# Patient Record
Sex: Female | Born: 1955 | Race: White | Hispanic: No | Marital: Single | Smoking: Never smoker
Health system: Southern US, Community
[De-identification: ages and names within clinical notes are randomized; demographics above are authoritative.]

## PROBLEM LIST (undated history)

## (undated) DIAGNOSIS — I10 Essential (primary) hypertension: Secondary | ICD-10-CM

## (undated) DIAGNOSIS — K219 Gastro-esophageal reflux disease without esophagitis: Secondary | ICD-10-CM

## (undated) DIAGNOSIS — E079 Disorder of thyroid, unspecified: Secondary | ICD-10-CM

## (undated) HISTORY — PX: CHOLECYSTECTOMY: SHX55

---

## 2013-11-25 ENCOUNTER — Encounter (HOSPITAL_COMMUNITY): Payer: Self-pay | Admitting: Emergency Medicine

## 2013-11-25 ENCOUNTER — Emergency Department (HOSPITAL_COMMUNITY): Payer: BC Managed Care – PPO

## 2013-11-25 ENCOUNTER — Emergency Department (HOSPITAL_COMMUNITY)
Admission: EM | Admit: 2013-11-25 | Discharge: 2013-11-25 | Disposition: A | Discharge status: Home / Self Care | Payer: BC Managed Care – PPO | Attending: Emergency Medicine | Admitting: Emergency Medicine

## 2013-11-25 DIAGNOSIS — Z8719 Personal history of other diseases of the digestive system: Secondary | ICD-10-CM | POA: Insufficient documentation

## 2013-11-25 DIAGNOSIS — R42 Dizziness and giddiness: Secondary | ICD-10-CM | POA: Insufficient documentation

## 2013-11-25 DIAGNOSIS — E079 Disorder of thyroid, unspecified: Secondary | ICD-10-CM | POA: Insufficient documentation

## 2013-11-25 DIAGNOSIS — R059 Cough, unspecified: Secondary | ICD-10-CM | POA: Insufficient documentation

## 2013-11-25 DIAGNOSIS — I10 Essential (primary) hypertension: Secondary | ICD-10-CM

## 2013-11-25 DIAGNOSIS — R945 Abnormal results of liver function studies: Secondary | ICD-10-CM | POA: Insufficient documentation

## 2013-11-25 DIAGNOSIS — R7989 Other specified abnormal findings of blood chemistry: Secondary | ICD-10-CM

## 2013-11-25 DIAGNOSIS — J3489 Other specified disorders of nose and nasal sinuses: Secondary | ICD-10-CM | POA: Insufficient documentation

## 2013-11-25 DIAGNOSIS — R0789 Other chest pain: Secondary | ICD-10-CM

## 2013-11-25 DIAGNOSIS — R05 Cough: Secondary | ICD-10-CM | POA: Insufficient documentation

## 2013-11-25 HISTORY — DX: Gastro-esophageal reflux disease without esophagitis: K21.9

## 2013-11-25 HISTORY — DX: Disorder of thyroid, unspecified: E07.9

## 2013-11-25 LAB — COMPREHENSIVE METABOLIC PANEL
ALT: 48 U/L — ABNORMAL HIGH (ref 0–35)
AST: 54 U/L — ABNORMAL HIGH (ref 0–37)
Alkaline Phosphatase: 62 U/L (ref 39–117)
CO2: 26 mEq/L (ref 19–32)
Calcium: 10.5 mg/dL (ref 8.4–10.5)
Potassium: 3.7 mEq/L (ref 3.5–5.1)
Sodium: 140 mEq/L (ref 135–145)
Total Protein: 8.4 g/dL — ABNORMAL HIGH (ref 6.0–8.3)

## 2013-11-25 LAB — CBC WITH DIFFERENTIAL/PLATELET
Basophils Absolute: 0 10*3/uL (ref 0.0–0.1)
Basophils Relative: 1 % (ref 0–1)
Eosinophils Absolute: 0.2 10*3/uL (ref 0.0–0.7)
Eosinophils Relative: 3 % (ref 0–5)
Lymphocytes Relative: 33 % (ref 12–46)
Lymphs Abs: 2.2 10*3/uL (ref 0.7–4.0)
MCHC: 33.6 g/dL (ref 30.0–36.0)
MCV: 89.9 fL (ref 78.0–100.0)
Monocytes Absolute: 0.4 10*3/uL (ref 0.1–1.0)
Neutrophils Relative %: 58 % (ref 43–77)
Platelets: 238 10*3/uL (ref 150–400)
RBC: 4.64 MIL/uL (ref 3.87–5.11)
RDW: 13 % (ref 11.5–15.5)
WBC: 6.6 10*3/uL (ref 4.0–10.5)

## 2013-11-25 NOTE — ED Notes (Signed)
Pt denies any complaints at this time

## 2013-11-25 NOTE — ED Notes (Signed)
Pt arrived and computers in down time.

## 2013-11-25 NOTE — ED Provider Notes (Signed)
CSN: [id number]     Arrival date & time 11/25/13  1734 History   First MD Initiated Contact with Patient 11/25/13 1745   This chart was scribed for Ward Givens, MD by Valera Castle, ED Scribe. This patient was seen in room [facility]12[facility] and the patient's care was started at 5:46 PM.    Chief Complaint  Patient presents with  . Chest Pain   The history is provided by the patient. No language interpreter was used.   HPI Comments: AHAVA KISSOON is a 57 y.o. female who presents to the Emergency Department complaining of one episode of sudden, sharp chest pain, that lasted for a few seconds, with associated dizziness, onset earlier this afternoon while standing at  work. She reports she had to sit down from a standing position due to the dizziness, stating she felt like she was going to pass out. She denies LOC. She reports having similar pain 4 evenings ago, without dizziness since she was already sitting down. She denies any more episodes of pain and dizziness since this afternoon. She states she was seen by PCP for her chest pain this afternoon, where she had EKGs performed, and was then referred to [facility]. She did not receive any medication while there. Her papers states she had actually been seen there earlier today for a regular appointment for her BP and her thyroid medications. She reports high blood pressure. She reports being started on blood pressure medication in 06/2013. She reports taking Synthroid every morning for her h/o thyroid disease. She states the past couple of days she has taken Benadryl for dry cough, usually 2-3 times a day. She reports associated rhinorrhea, with clear discharge, and sneezing. She denies numbness, tingling, fever, SOB, wheezing, and any other associated symptoms. She denies h/o smoking, EtOH use. She reports her father had  COPD and 2 MIs and died when he was 59 years old. She denies her mother having heart problems.   PCP - [facility]   Past  Medical History  Diagnosis Date  . Thyroid disease   . Acid reflux    History reviewed. No pertinent past surgical history. No family history on file. History  Substance Use Topics  . Smoking status: Never Smoker   . Smokeless tobacco: Not on file  . Alcohol Use: No   Lives at home Lives with spouse  OB History   Grav Para Term Preterm Abortions TAB SAB Ect Mult Living                 Review of Systems  Constitutional: Negative for fever.  HENT: Positive for rhinorrhea (clear discharge) and sneezing.   Respiratory: Positive for cough (dry, whooping). Negative for shortness of breath and wheezing.   Cardiovascular: Positive for chest pain (1 episode lasting seconds, sharp pain).  Musculoskeletal: Negative.  Negative for arthralgias, gait problem and myalgias.  Neurological: Positive for dizziness. Negative for syncope and numbness.  All other systems reviewed and are negative.    Allergies  Review of patient's allergies indicates no known allergies.  Home Medications   ED Triage Vitals  Enc Vitals Group     BP 11/25/13 2008 156/75 mmHg     Pulse Rate 11/25/13 2008 95     Resp 11/25/13 2008 16     Temp 11/25/13 2008 98.1 F (36.7 C)     Temp src 11/25/13 2008 Oral     SpO2 11/25/13 2008 98 %     Weight 11/25/13  1751 191 lb (86.637 kg)     Height 11/25/13 1751 5' (1.524 m)     Head Cir --      Peak Flow --      Pain Score --      Pain Loc --      Pain Edu? --      Excl. in GC? --    Vital signs normal mild hypertension   Physical Exam  Nursing note and vitals reviewed. Constitutional: She is oriented to person, place, and time. She appears well-developed and well-nourished.  Non-toxic appearance. She does not appear ill. No distress.  HENT:  Head: Normocephalic and atraumatic.  Right Ear: External ear normal.  Left Ear: External ear normal.  Nose: Nose normal. No mucosal edema or rhinorrhea.  Mouth/Throat: Oropharynx is clear and moist and mucous  membranes are normal. No dental abscesses or uvula swelling.  Eyes: Conjunctivae and EOM are normal. Pupils are equal, round, and reactive to light.  Neck: Normal range of motion and full passive range of motion without pain. Neck supple.  Cardiovascular: Normal rate, regular rhythm and normal heart sounds.  Exam reveals no gallop and no friction rub.   No murmur heard. Pulmonary/Chest: Effort normal and breath sounds normal. No respiratory distress. She has no wheezes. She has no rhonchi. She has no rales. She exhibits no tenderness and no crepitus.    Mildly tender left chest  Abdominal: Soft. Normal appearance and bowel sounds are normal. She exhibits no distension. There is no tenderness. There is no rebound and no guarding.  Musculoskeletal: Normal range of motion. She exhibits no edema and no tenderness.  Moves all extremities well.   Neurological: She is alert and oriented to person, place, and time. She has normal strength. No cranial nerve deficit.  Skin: Skin is warm, dry and intact. No rash noted. No erythema. No pallor.  Psychiatric: She has a normal mood and affect. Her speech is normal and behavior is normal. Her mood appears not anxious.    ED Course  Procedures (including critical care time)  COORDINATION OF CARE: 5:50 PM-Discussed treatment plan which includes CXR, CBC, CMP, Troponin, EKG, and cardiac monitoring with pt at bedside and pt agreed to plan.   PT has had no more episodes of chest pain while in the ED.   Labs Review Results for orders placed during the hospital encounter of 11/25/13  CBC WITH DIFFERENTIAL      Result Value Range   WBC 6.6  4.0 - 10.5 K/uL   RBC 4.64  3.87 - 5.11 MIL/uL   Hemoglobin 14.0  12.0 - 15.0 g/dL   HCT 78.2  95.6 - 21.3 %   MCV 89.9  78.0 - 100.0 fL   MCH 30.2  26.0 - 34.0 pg   MCHC 33.6  30.0 - 36.0 g/dL   RDW 08.6  57.8 - 46.9 %   Platelets 238  150 - 400 K/uL   Neutrophils Relative % 58  43 - 77 %   Neutro Abs 3.8  1.7 -  7.7 K/uL   Lymphocytes Relative 33  12 - 46 %   Lymphs Abs 2.2  0.7 - 4.0 K/uL   Monocytes Relative 6  3 - 12 %   Monocytes Absolute 0.4  0.1 - 1.0 K/uL   Eosinophils Relative 3  0 - 5 %   Eosinophils Absolute 0.2  0.0 - 0.7 K/uL   Basophils Relative 1  0 - 1 %   Basophils Absolute  0.0  0.0 - 0.1 K/uL  COMPREHENSIVE METABOLIC PANEL      Result Value Range   Sodium 140  135 - 145 mEq/L   Potassium 3.7  3.5 - 5.1 mEq/L   Chloride 101  96 - 112 mEq/L   CO2 26  19 - 32 mEq/L   Glucose, Bld 89  70 - 99 mg/dL   BUN 10  6 - 23 mg/dL   Creatinine, Ser 1.61  0.50 - 1.10 mg/dL   Calcium 09.6  8.4 - 04.5 mg/dL   Total Protein 8.4 (*) 6.0 - 8.3 g/dL   Albumin 4.2  3.5 - 5.2 g/dL   AST 54 (*) 0 - 37 U/L   ALT 48 (*) 0 - 35 U/L   Alkaline Phosphatase 62  39 - 117 U/L   Total Bilirubin 0.2 (*) 0.3 - 1.2 mg/dL   GFR calc non Af Amer 69 (*) >90 mL/min   GFR calc Af Amer 80 (*) >90 mL/min  TROPONIN I      Result Value Range   Troponin I <0.30  <0.30 ng/mL   Laboratory interpretation all normal except mild elevation of LFT's (pt denies alcohol, cholesterol medications)     Imaging Review Dg Chest 2 View  11/25/2013   CLINICAL DATA:  Chest pain on Monday. Dizziness. Rapid heartbeat. History of hypertension.  EXAM: CHEST  2 VIEW  COMPARISON:  None.  FINDINGS: Heart size is normal. The lungs are free of focal consolidations and pleural effusions. No pulmonary edema. Mild mid thoracic degenerative changes are noted.  IMPRESSION: Negative exam.   Electronically Signed   By: Rosalie Gums M.D.   On: 11/25/2013 18:37    EKG Interpretation    Date/Time:  Thursday November 25 2013 19:51:09 EST Ventricular Rate:  73 PR Interval:  148 QRS Duration: 84 QT Interval:  414 QTC Calculation: 456 R Axis:   -21 Text Interpretation:  Normal sinus rhythm Normal ECG No previous ECGs available Confirmed by Pauletta Pickney  MD-I, Susie Ehresman ([id number]) on 11/25/2013 8:11:18 PM              MDM   1. Atypical chest pain    2. Elevated liver function tests   3. Hypertension     Plan discharge  Devoria Albe, MD, FACEP    I personally performed the services described in this documentation, which was scribed in my presence. The recorded information has been reviewed and considered.  Devoria Albe, MD, Armando Gang    Ward Givens, MD 11/26/13 [phone]

## 2013-11-25 NOTE — ED Notes (Signed)
Pt sent here from [facility] for evaluation of chest pain.

## 2017-09-05 ENCOUNTER — Encounter (HOSPITAL_COMMUNITY): Payer: Self-pay | Admitting: Emergency Medicine

## 2017-09-05 ENCOUNTER — Emergency Department (HOSPITAL_COMMUNITY)
Admission: EM | Admit: 2017-09-05 | Discharge: 2017-09-06 | Disposition: A | Discharge status: Home / Self Care | Payer: BLUE CROSS/BLUE SHIELD | Attending: Emergency Medicine | Admitting: Emergency Medicine

## 2017-09-05 ENCOUNTER — Emergency Department (HOSPITAL_COMMUNITY): Payer: BLUE CROSS/BLUE SHIELD

## 2017-09-05 DIAGNOSIS — Z79899 Other long term (current) drug therapy: Secondary | ICD-10-CM | POA: Diagnosis not present

## 2017-09-05 DIAGNOSIS — T502X5A Adverse effect of carbonic-anhydrase inhibitors, benzothiadiazides and other diuretics, initial encounter: Secondary | ICD-10-CM | POA: Diagnosis not present

## 2017-09-05 DIAGNOSIS — R079 Chest pain, unspecified: Secondary | ICD-10-CM

## 2017-09-05 DIAGNOSIS — E876 Hypokalemia: Secondary | ICD-10-CM

## 2017-09-05 DIAGNOSIS — I10 Essential (primary) hypertension: Secondary | ICD-10-CM | POA: Diagnosis not present

## 2017-09-05 HISTORY — DX: Essential (primary) hypertension: I10

## 2017-09-05 LAB — BASIC METABOLIC PANEL
ANION GAP: 10 (ref 5–15)
BUN: 13 mg/dL (ref 6–20)
CHLORIDE: 98 mmol/L — AB (ref 101–111)
CO2: 28 mmol/L (ref 22–32)
Calcium: 9.8 mg/dL (ref 8.9–10.3)
Creatinine, Ser: 0.84 mg/dL (ref 0.44–1.00)
GFR calc Af Amer: >60 mL/min (ref >60)
GFR calc non Af Amer: >60 mL/min (ref >60)
Glucose, Bld: 144 mg/dL — ABNORMAL HIGH (ref 65–99)
Potassium: 3 mmol/L — ABNORMAL LOW (ref 3.5–5.1)
SODIUM: 136 mmol/L (ref 135–145)

## 2017-09-05 LAB — I-STAT TROPONIN, ED: Troponin i, poc: 0.01 ng/mL (ref 0.00–0.08)

## 2017-09-05 LAB — CBC
HCT: 38.9 % (ref 36.0–46.0)
Hemoglobin: 13.2 g/dL (ref 12.0–15.0)
MCH: 29.7 pg (ref 26.0–34.0)
MCHC: 33.9 g/dL (ref 30.0–36.0)
MCV: 87.6 fL (ref 78.0–100.0)
PLATELETS: 193 10*3/uL (ref 150–400)
RBC: 4.44 MIL/uL (ref 3.87–5.11)
RDW: 13.1 % (ref 11.5–15.5)
WBC: 8.5 10*3/uL (ref 4.0–10.5)

## 2017-09-05 NOTE — ED Triage Notes (Signed)
[place]  Mid sternal CP  Worse when moving arms

## 2017-09-06 LAB — I-STAT TROPONIN, ED
Troponin i, poc: 0 ng/mL (ref 0.00–0.08)
Troponin i, poc: 0 ng/mL (ref 0.00–0.08)

## 2017-09-06 MED ORDER — POTASSIUM CHLORIDE CRYS ER 20 MEQ PO TBCR
40.0000 meq | EXTENDED_RELEASE_TABLET | Freq: Once | ORAL | Status: AC
  Administered 2017-09-06: 40 meq via ORAL
  Filled 2017-09-06: qty 2

## 2017-09-06 MED ORDER — ASPIRIN 81 MG PO CHEW
324.0000 mg | CHEWABLE_TABLET | Freq: Once | ORAL | Status: AC
  Administered 2017-09-06: 324 mg via ORAL
  Filled 2017-09-06: qty 4

## 2017-09-06 MED ORDER — POTASSIUM CHLORIDE CRYS ER 20 MEQ PO TBCR: EXTENDED_RELEASE_TABLET | ORAL | 0 refills | Status: AC

## 2017-09-06 MED ORDER — NITROGLYCERIN 0.4 MG SL SUBL
0.4000 mg | SUBLINGUAL_TABLET | SUBLINGUAL | Status: DC | PRN
  Administered 2017-09-06 (×2): 0.4 mg via SUBLINGUAL
  Filled 2017-09-06 (×2): qty 1

## 2017-09-06 MED ORDER — NITROGLYCERIN 0.4 MG SL SUBL: 0.4000 mg | SUBLINGUAL_TABLET | SUBLINGUAL | 0 refills | Status: AC | PRN

## 2017-09-06 NOTE — Discharge Instructions (Signed)
Take 81 mg aspirin once a day until you can see the cardiologist. If you ever have chest discomfort that does not get better with three nitroglycerin pills, then come to the emergency department immediately!

## 2017-09-06 NOTE — ED Provider Notes (Signed)
[facility]-EMER[facility]Y DEPT Provider Note   CSN: [id number] Arrival date & time: 09/05/17  2243     History   Chief Complaint Chief Complaint  Patient presents with  . Chest Pain    midsternal after eating a hotdog    HPI Kristin Stevens is a 61 y.o. female.  The history is provided by the patient.  She had onset about 9:30 PM of a heavy and tight feeling in her anterior chest without radiation. She rated this discomfort at 10/10, but it has improved where she only rates it at 8/10. There is no associated dyspnea, nausea, diaphoresis. She had been walking at the fair in [place], but that the discomfort actually came on as she was getting in her car. It is not worse with exertion. It seems to be worse with moving her arms. There is no associated dyspnea, nausea, diaphoresis. She does have history of having had a stress test 3 years ago, and discomfort at that time was statistically different. She is a nonsmoker with no history of diabetes or hyperlipidemia. She does have history of hypertension and there is a positive family history of premature coronary atherosclerosis.  Past Medical History:  Diagnosis Date  . Acid reflux   . Hypertension   . Thyroid disease     There are no active problems to display for this patient.   No past surgical history on file.  OB History    No data available       Home Medications    Prior to Admission medications   Medication Sig Start Date End Date Taking? Authorizing Provider  esomeprazole (NEXIUM) 20 MG capsule Take 20 mg by mouth daily.  08/11/17  Yes [provider]  hydrochlorothiazide (HYDRODIURIL) 25 MG tablet Take 25 mg by mouth daily.  08/11/17  Yes [provider]  levothyroxine (SYNTHROID, LEVOTHROID) 75 MCG tablet Take 75 mcg by mouth daily before breakfast.   Yes [provider]  Multiple Vitamin (MULTIVITAMIN WITH MINERALS) TABS tablet Take 1 tablet by mouth daily.   Yes [provider]    Family  History No family history on file.  Social History Social History  Substance Use Topics  . Smoking status: Never Smoker  . Smokeless tobacco: Never Used  . Alcohol use No     Allergies   Lisinopril   Review of Systems Review of Systems  All other systems reviewed and are negative.    Physical Exam Updated Vital Signs BP (!) 157/67 (BP Location: Left Arm)   Pulse 86   Temp 99.1 F (37.3 C) (Oral)   Resp 18   Ht 5' (1.524 m)   Wt 63.5 kg (140 lb)   SpO2 97%   BMI 27.34 kg/m   Physical Exam  Nursing note and vitals reviewed.  61 year old female, resting comfortably and in no acute distress. Vital signs are significant for hypertension. Oxygen saturation is 97%, which is normal. Head is normocephalic and atraumatic. PERRLA, EOMI. Oropharynx is clear. Neck is nontender and supple without adenopathy or JVD. Back is nontender and there is no CVA tenderness. Lungs are clear without rales, wheezes, or rhonchi. Chest has mild bilateral parasternal tenderness which does not reproduce her pain. Heart has regular rate and rhythm without murmur. Abdomen is soft, flat, nontender without masses or hepatosplenomegaly and peristalsis is normoactive. Extremities have no cyanosis or edema, full range of motion is present. Skin is warm and dry without rash. Neurologic: Mental status is normal, cranial nerves are  intact, there are no motor or sensory deficits.  ED Treatments / Results  Labs (all labs ordered are listed, but only abnormal results are displayed) Labs Reviewed  BASIC METABOLIC PANEL - Abnormal; Notable for the following:       Result Value   Potassium 3.0 (*)    Chloride 98 (*)    Glucose, Bld 144 (*)    All other components within normal limits  CBC  I-STAT TROPONIN, ED  I-STAT TROPONIN, ED  I-STAT TROPONIN, ED    EKG  EKG Interpretation  Date/Time:  Friday September 05 2017 23:02:21 EDT Ventricular Rate:  78 PR Interval:    QRS Duration: 94 QT  Interval:  399 QTC Calculation: 455 R Axis:   -10 Text Interpretation:  Sinus rhythm Normal ECG When compared with ECG of 11/25/2013, No significant change was found Confirmed by Dione Booze ([id number]) on 09/05/2017 11:11:02 PM       Radiology Dg Chest 2 View  Result Date: 09/05/2017 CLINICAL DATA:  Midsternal chest pain beginning after eating a hot dog today. Pain is worse with movement. History of hypertension. EXAM: CHEST  2 VIEW COMPARISON:  11/25/2013 FINDINGS: Normal heart size and pulmonary vascularity. No focal airspace disease or consolidation in the lungs. No blunting of costophrenic angles. No pneumothorax. Mediastinal contours appear intact. Degenerative changes in the spine. IMPRESSION: No active cardiopulmonary disease. Electronically Signed   By: Burman Nieves M.D.   On: 09/05/2017 23:27    Procedures Procedures (including critical care time)  Medications Ordered in ED Medications  nitroGLYCERIN (NITROSTAT) SL tablet 0.4 mg (0.4 mg Sublingual Given 09/06/17 0211)  potassium chloride SA (K-DUR,KLOR-CON) CR tablet 40 mEq (not administered)  potassium chloride SA (K-DUR,KLOR-CON) CR tablet 40 mEq (40 mEq Oral Given 09/06/17 0130)  aspirin chewable tablet 324 mg (324 mg Oral Given 09/06/17 0130)     Initial Impression / Assessment and Plan / ED Course  I have reviewed the triage vital signs and the nursing notes.  Pertinent labs & imaging results that were available during my care of the patient were reviewed by me and considered in my medical decision making (see chart for details).  Chest discomfort of uncertain cause. ECG shows no acute changes. Initial troponin is normal. Moderate hypokalemia is noted and she is given potassium supplementation. This is secondary to hydrochlorothiazide use. Review of old records shows ED visit in 2014 for chest pain, nuclear stress test and 2015 done at [facility] was normal. Heart score = 3, which does put her at low risk of major adverse cardiac  events in the next 30 days. However, I am concerned that she is having ongoing discomfort. She will be given aspirin and a therapeutic trial of nitroglycerin.  She had partial relief with the first nitroglycerin tablet, and complete relief with the second. Second and third troponins are undetectable. She has not had recurrence of pain. She is discharged with prescription for nitroglycerin and advised to take daily aspirin, referred to cardiology for consideration for outpatient stress testing. Return precautions discussed. Also given a prescription for potassium supplement.  Final Clinical Impressions(s) / ED Diagnoses   Final diagnoses:  Nonspecific chest pain  Diuretic-induced hypokalemia    New Prescriptions New Prescriptions   NITROGLYCERIN (NITROSTAT) 0.4 MG SL TABLET    Place 1 tablet (0.4 mg total) under the tongue every 5 (five) minutes as needed for chest pain.   POTASSIUM CHLORIDE SA (K-DUR,KLOR-CON) 20 MEQ TABLET    Take one tablet twice a day  for 7 days, then take one tablet a day.     Dione Booze, MD 09/06/17 [phone]

## 2017-09-06 NOTE — ED Notes (Signed)
I-Stat Troponin: 0.00 

## 2017-09-19 NOTE — Progress Notes (Signed)
Cardiology Office Note   Date:  09/23/2017   ID:  Kristin Stevens, DOB January 11, 1956, MRN [id number]  PCP:  [facility]  Cardiologist:   Charlton Haws, MD   No chief complaint on file.     History of Present Illness: Kristin Stevens is a 61 y.o. female who presents for consultation regarding chest pain Referred by Dr Durel Salts [facility] ER.  Reviewed notes from ER visit 09/06/17 10/10 anterior chest pain starting at night. Was at [place] in [place] and pain Started after getting in car. Worse with movement of upper extremities. No associated dyspnea, palpitations diaphoresis Or syncope. CRF;s include HTN and positive family history. She had a normal nuclear stress test at [facility] in 2015 She Did have some relief of pain in ER with 2 nitro. Troponin negative and ECG no acute changes Was hypokalemic from HCTZ CXR reviewed NAD.   Since d/c no recurrence She works for [employer]. 3 children one Casimiro Needle 28 lives with her has 3 grandchildren she Watches on weekends   Past Medical History:  Diagnosis Date  . Acid reflux   . Hypertension   . Thyroid disease     Past Surgical History:  Procedure Laterality Date  . CESAREAN SECTION    . CHOLECYSTECTOMY       Current Outpatient Prescriptions  Medication Sig Dispense Refill  . esomeprazole (NEXIUM) 20 MG capsule Take 20 mg by mouth daily.   5  . hydrochlorothiazide (HYDRODIURIL) 25 MG tablet Take 25 mg by mouth daily.   5  . levothyroxine (SYNTHROID, LEVOTHROID) 75 MCG tablet Take 75 mcg by mouth daily before breakfast.    . Multiple Vitamin (MULTIVITAMIN WITH MINERALS) TABS tablet Take 1 tablet by mouth daily.    . nitroGLYCERIN (NITROSTAT) 0.4 MG SL tablet Place 1 tablet (0.4 mg total) under the tongue every 5 (five) minutes as needed for chest pain. 30 tablet 0  . potassium chloride SA (K-DUR,KLOR-CON) 20 MEQ tablet Take one tablet twice a day for 7 days, then take one tablet a day. 37 tablet 0   No current  facility-administered medications for this visit.     Allergies:   Lisinopril    Social History:  The patient  reports that she has never smoked. She has never used smokeless tobacco. She reports that she does not drink alcohol or use drugs.   Family History:  The patient's family history includes Heart disease in her father.    ROS:  Please see the history of present illness.   Otherwise, review of systems are positive for none.   All other systems are reviewed and negative.    PHYSICAL EXAM: VS:  BP 128/74 (BP Location: Left Arm)   Pulse 63   Ht 5' (1.524 m)   Wt 147 lb (66.7 kg)   SpO2 98%   BMI 28.71 kg/m  , BMI Body mass index is 28.71 kg/m. Affect appropriate Healthy:  appears stated age HEENT: normal Neck supple with no adenopathy JVP normal no bruits no thyromegaly Lungs clear with no wheezing and good diaphragmatic motion Heart:  S1/S2 no murmur, no rub, gallop or click PMI normal Abdomen: benighn, BS positve, no tenderness, no AAA no bruit.  No HSM or HJR Distal pulses intact with no bruits No edema Neuro non-focal Skin warm and dry No muscular weakness    EKG:  09/08/17 SR rate 78 normal    Recent Labs: 09/05/2017: BUN 13; Creatinine, Ser 0.84; Hemoglobin  13.2; Platelets 193; Potassium 3.0; Sodium 136    Lipid Panel No results found for: CHOL, TRIG, HDL, CHOLHDL, VLDL, LDLCALC, LDLDIRECT    Wt Readings from Last 3 Encounters:  09/23/17 147 lb (66.7 kg)  09/05/17 140 lb (63.5 kg)  11/25/13 191 lb (86.6 kg)      Other studies Reviewed: Additional studies/ records that were reviewed today include: Notes ER 09/06/17 CXR ECG labs Nuclear stress test [facility] 2015.    ASSESSMENT AND PLAN:  1.  Chest pain isolated episode normal myovue 2015 normal ECG f/u ETT 2. HTN Well controlled.  Continue current medications and low sodium Dash type diet.   3. Thyroid: on replacement f/u TSH with primary  4. GERD: on nexium suggested weight loss and low carb diet      Current medicines are reviewed at length with the patient today.  The patient does not have concerns regarding medicines.  The following changes have been made:  no change  Labs/ tests ordered today include: ETT  No orders of the defined types were placed in this encounter.    Disposition:   FU with cardiology PRN      Signed, Charlton Haws, MD  09/23/2017 11:00 AM    [facility] [address] [place], [place], [place]  [address] Phone: [phone]; Fax: [phone]

## 2017-09-23 ENCOUNTER — Encounter: Payer: Self-pay | Admitting: Cardiovascular Disease

## 2017-09-23 ENCOUNTER — Ambulatory Visit (INDEPENDENT_AMBULATORY_CARE_PROVIDER_SITE_OTHER): Payer: BLUE CROSS/BLUE SHIELD | Admitting: Cardiovascular Disease

## 2017-09-23 VITALS — BP 128/74 | HR 63 | Ht 60.0 in | Wt 147.0 lb

## 2017-09-23 DIAGNOSIS — R079 Chest pain, unspecified: Secondary | ICD-10-CM

## 2017-09-23 NOTE — Patient Instructions (Signed)
Medication Instructions:  Your physician recommends that you continue on your current medications as directed. Please refer to the Current Medication list given to you today.   Labwork: NONE  Testing/Procedures: Your physician has requested that you have an exercise tolerance test. For further information please visit www.[email or web address]. Please also follow instruction sheet, as given.    Follow-Up: Your physician recommends that you schedule a follow-up appointment in: AS NEEDED    Any Other Special Instructions Will Be Listed Below (If Applicable).     If you need a refill on your cardiac medications before your next appointment, please call your pharmacy.   

## 2017-10-06 ENCOUNTER — Ambulatory Visit (HOSPITAL_COMMUNITY)
Admission: RE | Admit: 2017-10-06 | Discharge: 2017-10-06 | Disposition: A | Discharge status: Home / Self Care | Payer: BLUE CROSS/BLUE SHIELD | Source: Ambulatory Visit | Attending: Cardiovascular Disease | Admitting: Cardiovascular Disease

## 2017-10-06 DIAGNOSIS — R079 Chest pain, unspecified: Secondary | ICD-10-CM | POA: Diagnosis present

## 2017-10-06 LAB — EXERCISE TOLERANCE TEST
CSEPED: 2 min
CSEPEDS: 51 s
CSEPEW: 4.6 METS
CSEPHR: 88 %
CSEPPHR: 139 {beats}/min
MPHR: 157 {beats}/min
RPE: 19
Rest HR: 60 {beats}/min

## 2017-10-07 ENCOUNTER — Encounter: Payer: Self-pay | Admitting: *Deleted

## 2017-10-07 ENCOUNTER — Telehealth: Payer: Self-pay | Admitting: *Deleted

## 2017-10-07 DIAGNOSIS — R079 Chest pain, unspecified: Secondary | ICD-10-CM

## 2017-10-07 NOTE — Telephone Encounter (Signed)
Called patient with test results. No answer. Left message to call back. Order placed  

## 2017-10-07 NOTE — Telephone Encounter (Signed)
-----   Message from Wendall StadePeter C Nishan, MD sent at 10/06/2017  5:47 PM EDT ----- F/u lexiscan myovue ETT not interpretable

## 2017-10-08 ENCOUNTER — Telehealth: Payer: Self-pay

## 2017-10-08 NOTE — Telephone Encounter (Signed)
Called pt. No answer. Left message for pt to return call.  

## 2017-10-08 NOTE — Telephone Encounter (Signed)
-----   Message from Peter C Nishan, MD sent at 10/06/2017  5:47 PM EDT ----- F/u lexiscan myovue ETT not interpretable 

## 2017-10-13 ENCOUNTER — Ambulatory Visit (HOSPITAL_COMMUNITY): Payer: BLUE CROSS/BLUE SHIELD

## 2017-10-13 ENCOUNTER — Encounter (HOSPITAL_COMMUNITY): Payer: BLUE CROSS/BLUE SHIELD

## 2020-10-03 ENCOUNTER — Encounter (HOSPITAL_COMMUNITY): Payer: Self-pay | Admitting: Emergency Medicine

## 2020-10-03 ENCOUNTER — Other Ambulatory Visit: Payer: Self-pay

## 2020-10-03 ENCOUNTER — Emergency Department (HOSPITAL_COMMUNITY): Payer: No Typology Code available for payment source

## 2020-10-03 ENCOUNTER — Emergency Department (HOSPITAL_COMMUNITY)
Admission: EM | Admit: 2020-10-03 | Discharge: 2020-10-03 | Disposition: A | Discharge status: Home / Self Care | Payer: No Typology Code available for payment source | Attending: Emergency Medicine | Admitting: Emergency Medicine

## 2020-10-03 DIAGNOSIS — I10 Essential (primary) hypertension: Secondary | ICD-10-CM | POA: Insufficient documentation

## 2020-10-03 DIAGNOSIS — Y92410 Unspecified street and highway as the place of occurrence of the external cause: Secondary | ICD-10-CM | POA: Insufficient documentation

## 2020-10-03 DIAGNOSIS — R0781 Pleurodynia: Secondary | ICD-10-CM | POA: Insufficient documentation

## 2020-10-03 DIAGNOSIS — Z79899 Other long term (current) drug therapy: Secondary | ICD-10-CM | POA: Diagnosis not present

## 2020-10-03 MED ORDER — METHOCARBAMOL 500 MG PO TABS: 500.0000 mg | ORAL_TABLET | Freq: Two times a day (BID) | ORAL | 0 refills | Status: AC

## 2020-10-03 MED ORDER — NAPROXEN 500 MG PO TABS: 500.0000 mg | ORAL_TABLET | Freq: Two times a day (BID) | ORAL | 0 refills | Status: DC
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
## 2020-10-03 NOTE — Discharge Instructions (Signed)
Your xray did not show any signs of fractures today. Your pain may be musculoskeletal in nature. Please pick up medication and take as prescribed. DO NOT DRIVE WHILE ON THE MUSCLE RELAXER AS THIS CAN MAKE YOU DROWSY.   Follow up with your PCP regarding your ED visit and for recheck next week  Return to the ED IMMEDIATELY for any worsening symptoms including shortness of breath, severe pain, coughing up blood, fevers > 100.4, chills, or any other new/concerning symptoms.
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
## 2020-11-16 ENCOUNTER — Other Ambulatory Visit (HOSPITAL_COMMUNITY): Payer: Self-pay | Admitting: Family Medicine

## 2020-11-16 DIAGNOSIS — Z13820 Encounter for screening for osteoporosis: Secondary | ICD-10-CM

## 2021-01-15 ENCOUNTER — Other Ambulatory Visit (HOSPITAL_COMMUNITY): Payer: Self-pay | Admitting: Family Medicine

## 2021-01-15 DIAGNOSIS — Z13820 Encounter for screening for osteoporosis: Secondary | ICD-10-CM

## 2022-03-13 ENCOUNTER — Other Ambulatory Visit (HOSPITAL_COMMUNITY): Payer: Self-pay | Admitting: Family Medicine

## 2022-03-13 DIAGNOSIS — Z780 Asymptomatic menopausal state: Secondary | ICD-10-CM
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
## 2023-03-19 ENCOUNTER — Other Ambulatory Visit (HOSPITAL_COMMUNITY): Payer: Self-pay | Admitting: Family Medicine

## 2023-03-19 DIAGNOSIS — Z13820 Encounter for screening for osteoporosis: Secondary | ICD-10-CM

## 2024-12-28 ENCOUNTER — Encounter (HOSPITAL_COMMUNITY): Payer: Self-pay

## 2024-12-28 ENCOUNTER — Emergency Department (HOSPITAL_COMMUNITY)
Admission: EM | Admit: 2024-12-28 | Discharge: 2024-12-28 | Disposition: A | Discharge status: Home / Self Care | Attending: Emergency Medicine | Admitting: Emergency Medicine

## 2024-12-28 ENCOUNTER — Other Ambulatory Visit: Payer: Self-pay

## 2024-12-28 ENCOUNTER — Emergency Department (HOSPITAL_COMMUNITY)

## 2024-12-28 DIAGNOSIS — M25552 Pain in left hip: Secondary | ICD-10-CM | POA: Insufficient documentation

## 2024-12-28 MED ORDER — MELOXICAM 15 MG PO TABS: 15.0000 mg | ORAL_TABLET | Freq: Every day | ORAL | 0 refills | Status: AC

## 2024-12-28 NOTE — ED Provider Notes (Signed)
 " [place] EMERGENCY DEPARTMENT AT [facility] Provider Note   CSN: [id number] Arrival date & time: 12/28/24  9065     Patient presents with: Hip Pain   Kristin Stevens is a 69 y.o. female.    Hip Pain  69 year old presenting with left hip pain.  This pain has been going on for about a month now.  The pain switches between her right and left hip.  Since Saturday the pain has been in her right hip and has been constant.  She has tried taking ibuprofen, Tylenol, gabapentin, and a muscle relaxant with no help.  She is still able to walk.  She denies any numbness or tingling.  She denies any paracentesis.  She denies any other pain.  She denies any bowel/bladder incontinence.     Prior to Admission medications  Medication Sig Start Date End Date Taking? Authorizing Provider  esomeprazole (NEXIUM) 20 MG capsule Take 20 mg by mouth daily.  08/11/17   [provider]  hydrochlorothiazide (HYDRODIURIL) 25 MG tablet Take 25 mg by mouth daily.  08/11/17   [provider]  levothyroxine (SYNTHROID, LEVOTHROID) 75 MCG tablet Take 75 mcg by mouth daily before breakfast.    [provider]  methocarbamol  (ROBAXIN ) 500 MG tablet Take 1 tablet (500 mg total) by mouth 2 (two) times daily. 10/03/20   Venter, Margaux, PA-C  Multiple Vitamin (MULTIVITAMIN WITH MINERALS) TABS tablet Take 1 tablet by mouth daily.    [provider]  naproxen  (NAPROSYN ) 500 MG tablet Take 1 tablet (500 mg total) by mouth 2 (two) times daily. 10/03/20   Venter, Margaux, PA-C  nitroGLYCERIN  (NITROSTAT ) 0.4 MG SL tablet Place 1 tablet (0.4 mg total) under the tongue every 5 (five) minutes as needed for chest pain. 09/06/17   Raford Lenis, MD  potassium chloride  SA (K-DUR,KLOR-CON ) 20 MEQ tablet Take one tablet twice a day for 7 days, then take one tablet a day. 09/06/17   Raford Lenis, MD    Allergies: Lisinopril    Review of Systems  All other systems reviewed and are  negative.   Updated Vital Signs BP (!) 185/65 (BP Location: Right Arm)   Pulse 77   Temp 97.8 F (36.6 C) (Oral)   Resp 16   Ht 5' (1.524 m)   Wt 70.3 kg   BMI 30.27 kg/m   Physical Exam Vitals and nursing note reviewed.  Eyes:     General: No scleral icterus. Cardiovascular:     Pulses: Normal pulses.  Pulmonary:     Effort: Pulmonary effort is normal.  Musculoskeletal:     Right hip: Normal.     Left hip: Tenderness present. No deformity, lacerations, bony tenderness or crepitus. Normal range of motion. Normal strength.       Legs:     Comments: The left leg was not internally or externally rotated.  It was not shortened.  Patient was able to walk with a limp.  Patient has slight tenderness to palpation over the left hip.  No signs of skin breakdown.  No signs of erythema.  No swelling noted.  Skin:    Coloration: Skin is not jaundiced.     Findings: No rash.  Neurological:     General: No focal deficit present.     Mental Status: She is alert.     (all labs ordered are listed, but only abnormal results are displayed) Labs Reviewed - No data to display  EKG: None  Radiology: No results  found.   Procedures   Medications Ordered in the ED - No data to display                                  Medical Decision Making  Impression: Tenderness to palpation 69 year old female presenting with hip pain.  Differential diagnosis include osteoarthritis, septic arthritis, fracture, dislocation, nerve impingement  Additional History: Patient was able to provide history.  I also reviewed other outpatient notes.  Labs: None  Imaging: X-ray of the hips showed minimal degenerative joint disease.  I reviewed the images and agree with radiology's report.  ED Course/Meds: 69 year old female presenting to the ER with right hip pain.  This pain has been going on for about a month now.  She notices that it switches hips.  The pain that she is been having now has been going on  for about 3 days.  She has tried taking multiple medications to help with the pain however it has not gotten any better.  She reports that sometimes whenever she lays on the heating pad it feels a little bit better.  There was mild tenderness to palpation over the right hip.  On physical exam there was no swelling, erythema, skin breakdown, or other signs of trauma.  Vital signs were stable with the exception of an elevated blood pressure.  Due to this septic arthritis is less likely.  X-ray of the hip showed minimal degenerative joint disease.  After discussion with the patient she was good with following up with orthopedic or her primary care for this pain.  I prescribed a NSAID to help with her pain. Patient reports that she has had a elevated blood pressure for a while.  She has been seeing her primary care for this.  Last time she was at her primary care she had not taken her blood pressure medications so her family medicine provider recommended that she go home and take it and then they would discuss changing her medications next time.  I recommended that she follow-up with her primary care for possible changes of her blood pressure medication.  She did was not having any symptoms of hypertension emergency or urgency. I educated on signs and symptoms of when to return to the ER.  Patient voiced that she understood that this.  Patient remained stable while in the ER and at discharge.      Final diagnoses:  None    ED Discharge Orders     None          Kristin Stevens 12/28/24 1128    Melvenia Motto, MD 12/28/24 1620  "

## 2024-12-28 NOTE — Discharge Instructions (Addendum)
 You can start taking the Mobic  whenever you pick it up.  Take it as prescribed.  I have put a orthopedic doctor for you to call for a follow-up.  You may also follow-up with your primary care if you would prefer that.  Also would follow-up with your primary care for your elevated blood pressure.  If you start to develop any difficulty walking, fever, swelling, numbness or tingling please return to the ER.

## 2024-12-28 NOTE — ED Triage Notes (Signed)
 Pt arrived via POV c/o left hip pain that alternates from her right to left side. Pt reports falling last month as well.

## 2024-12-28 NOTE — ED Notes (Signed)
 ED Provider at bedside.
